# Patient Record
Sex: Male | Born: 2000
Health system: Southern US, Community
[De-identification: ages and names within clinical notes are randomized; demographics above are authoritative.]

## PROBLEM LIST (undated history)

## (undated) HISTORY — PX: TYMPANOSTOMY TUBE PLACEMENT: SHX32

---
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
## 2019-01-06 ENCOUNTER — Encounter

## 2019-01-12 ENCOUNTER — Ambulatory Visit (HOSPITAL_COMMUNITY): Payer: 59 | Admitting: Physical Therapy

## 2019-01-12 ENCOUNTER — Encounter (HOSPITAL_COMMUNITY): Payer: Self-pay

## 2019-01-25 ENCOUNTER — Ambulatory Visit (HOSPITAL_COMMUNITY): Payer: 59 | Admitting: Physical Therapy

## 2019-09-05 DIAGNOSIS — M25512 Pain in left shoulder: Secondary | ICD-10-CM | POA: Diagnosis not present

## 2019-09-05 MED FILL — predniSONE 10 MG TABS: 10 | 12 days supply | Qty: 42 | Fill #0

## 2019-09-08 ENCOUNTER — Encounter: Payer: Self-pay | Admitting: Family Medicine

## 2019-09-08 ENCOUNTER — Telehealth (INDEPENDENT_AMBULATORY_CARE_PROVIDER_SITE_OTHER): Payer: 59 | Admitting: Family Medicine

## 2019-09-08 ENCOUNTER — Other Ambulatory Visit: Payer: Self-pay

## 2019-09-08 VITALS — Temp 100.4°F | Ht 72.0 in | Wt 185.0 lb

## 2019-09-08 DIAGNOSIS — R509 Fever, unspecified: Secondary | ICD-10-CM | POA: Diagnosis not present

## 2019-09-08 DIAGNOSIS — U071 COVID-19: Secondary | ICD-10-CM

## 2019-09-08 DIAGNOSIS — R05 Cough: Secondary | ICD-10-CM

## 2019-09-08 DIAGNOSIS — R52 Pain, unspecified: Secondary | ICD-10-CM | POA: Diagnosis not present

## 2019-09-08 DIAGNOSIS — R110 Nausea: Secondary | ICD-10-CM

## 2019-09-08 DIAGNOSIS — R109 Unspecified abdominal pain: Secondary | ICD-10-CM | POA: Diagnosis not present

## 2019-09-08 DIAGNOSIS — R0989 Other specified symptoms and signs involving the circulatory and respiratory systems: Secondary | ICD-10-CM | POA: Diagnosis not present

## 2019-09-08 DIAGNOSIS — R059 Cough, unspecified: Secondary | ICD-10-CM

## 2019-09-08 LAB — POC COVID19 BINAXNOW: SARS Coronavirus 2 Ag: POSITIVE — AB

## 2019-09-08 LAB — POCT INFLUENZA A/B
Influenza A, POC: NEGATIVE
Influenza B, POC: NEGATIVE

## 2019-09-08 NOTE — Patient Instructions (Signed)
Use tylenol as needed for pain (headache) and fever. Avoid Advil (also because you're currently on prednisone).  You may use decongestants such as sudafed as needed for congestion and sinus pain. I recommend using single ingredient rather than multi-symptom medication so you don't overlap and get too much of acetaminophen, dextromethorphan or other ingredients.  Drink plenty of fluid. You may use Mucinex DM 12 hour twice daily for cough (you can use Robitussin DM instead, but it is short-acting). If you already have Delsym syrup at home (which is the DM which lasts 12 hours), then just get a plain mucinex 12 hour.  If this isn't adequately helping your cough, the next cough medication options are prescription only--tessalon (tablets which aren't sedating) or hydrocodone syrup (sedating, can't take while working, mainly to take at night so you can sleep).   COVID-19 COVID-19 is a respiratory infection that is caused by a virus called severe acute respiratory syndrome coronavirus 2 (SARS-CoV-2). The disease is also known as coronavirus disease or novel coronavirus. In some people, the virus may not cause any symptoms. In others, it may cause a serious infection. The infection can get worse quickly and can lead to complications, such as:  Pneumonia, or infection of the lungs.  Acute respiratory distress syndrome or ARDS. This is a condition in which fluid build-up in the lungs prevents the lungs from filling with air and passing oxygen into the blood.  Acute respiratory failure. This is a condition in which there is not enough oxygen passing from the lungs to the body or when carbon dioxide is not passing from the lungs out of the body.  Sepsis or septic shock. This is a serious bodily reaction to an infection.  Blood clotting problems.  Secondary infections due to bacteria or fungus.  Organ failure. This is when your body's organs stop working. The virus that causes COVID-19 is contagious.  This means that it can spread from person to person through droplets from coughs and sneezes (respiratory secretions). What are the causes? This illness is caused by a virus. You may catch the virus by:  Breathing in droplets from an infected person. Droplets can be spread by a person breathing, speaking, singing, coughing, or sneezing.  Touching something, like a table or a doorknob, that was exposed to the virus (contaminated) and then touching your mouth, nose, or eyes. What increases the risk? Risk for infection You are more likely to be infected with this virus if you:  Are within 6 feet (2 meters) of a person with COVID-19.  Provide care for or live with a person who is infected with COVID-19.  Spend time in crowded indoor spaces or live in shared housing. Risk for serious illness You are more likely to become seriously ill from the virus if you:  Are 21 years of age or older. The higher your age, the more you are at risk for serious illness.  Live in a nursing home or long-term care facility.  Have cancer.  Have a long-term (chronic) disease such as: ? Chronic lung disease, including chronic obstructive pulmonary disease or asthma. ? A long-term disease that lowers your body's ability to fight infection (immunocompromised). ? Heart disease, including heart failure, a condition in which the arteries that lead to the heart become narrow or blocked (coronary artery disease), a disease which makes the heart muscle thick, weak, or stiff (cardiomyopathy). ? Diabetes. ? Chronic kidney disease. ? Sickle cell disease, a condition in which red blood cells have an  abnormal "sickle" shape. ? Liver disease.  Are obese. What are the signs or symptoms? Symptoms of this condition can range from mild to severe. Symptoms may appear any time from 2 to 14 days after being exposed to the virus. They include:  A fever or chills.  A cough.  Difficulty breathing.  Headaches, body aches, or  muscle aches.  Runny or stuffy (congested) nose.  A sore throat.  New loss of taste or smell. Some people may also have stomach problems, such as nausea, vomiting, or diarrhea. Other people may not have any symptoms of COVID-19. How is this diagnosed? This condition may be diagnosed based on:  Your signs and symptoms, especially if: ? You live in an area with a COVID-19 outbreak. ? You recently traveled to or from an area where the virus is common. ? You provide care for or live with a person who was diagnosed with COVID-19. ? You were exposed to a person who was diagnosed with COVID-19.  A physical exam.  Lab tests, which may include: ? Taking a sample of fluid from the back of your nose and throat (nasopharyngeal fluid), your nose, or your throat using a swab. ? A sample of mucus from your lungs (sputum). ? Blood tests.  Imaging tests, which may include, X-rays, CT scan, or ultrasound. How is this treated? At present, there is no medicine to treat COVID-19. Medicines that treat other diseases are being used on a trial basis to see if they are effective against COVID-19. Your health care provider will talk with you about ways to treat your symptoms. For most people, the infection is mild and can be managed at home with rest, fluids, and over-the-counter medicines. Treatment for a serious infection usually takes places in a hospital intensive care unit (ICU). It may include one or more of the following treatments. These treatments are given until your symptoms improve.  Receiving fluids and medicines through an IV.  Supplemental oxygen. Extra oxygen is given through a tube in the nose, a face mask, or a hood.  Positioning you to lie on your stomach (prone position). This makes it easier for oxygen to get into the lungs.  Continuous positive airway pressure (CPAP) or bi-level positive airway pressure (BPAP) machine. This treatment uses mild air pressure to keep the airways open. A  tube that is connected to a motor delivers oxygen to the body.  Ventilator. This treatment moves air into and out of the lungs by using a tube that is placed in your windpipe.  Tracheostomy. This is a procedure to create a hole in the neck so that a breathing tube can be inserted.  Extracorporeal membrane oxygenation (ECMO). This procedure gives the lungs a chance to recover by taking over the functions of the heart and lungs. It supplies oxygen to the body and removes carbon dioxide. Follow these instructions at home: Lifestyle  If you are sick, stay home except to get medical care. Your health care provider will tell you how long to stay home. Call your health care provider before you go for medical care.  Rest at home as told by your health care provider.  Do not use any products that contain nicotine or tobacco, such as cigarettes, e-cigarettes, and chewing tobacco. If you need help quitting, ask your health care provider.  Return to your normal activities as told by your health care provider. Ask your health care provider what activities are safe for you. General instructions  Take over-the-counter and prescription  medicines only as told by your health care provider.  Drink enough fluid to keep your urine pale yellow.  Keep all follow-up visits as told by your health care provider. This is important. How is this prevented?  There is no vaccine to help prevent COVID-19 infection. However, there are steps you can take to protect yourself and others from this virus. To protect yourself:   Do not travel to areas where COVID-19 is a risk. The areas where COVID-19 is reported change often. To identify high-risk areas and travel restrictions, check the CDC travel website: [employer].siwwwnc.cdc.gov/travel/notices  If you live in, or must travel to, an area where COVID-19 is a risk, take precautions to avoid infection. ? Stay away from people who are sick. ? Wash your hands often with soap and water  for 20 seconds. If soap and water are not available, use an alcohol-based hand sanitizer. ? Avoid touching your mouth, face, eyes, or nose. ? Avoid going out in public, follow guidance from your state and local health authorities. ? If you must go out in public, wear a cloth face covering or face mask. Make sure your mask covers your nose and mouth. ? Avoid crowded indoor spaces. Stay at least 6 feet (2 meters) away from others. ? Disinfect objects and surfaces that are frequently touched every day. This may include:  Counters and tables.  Doorknobs and light switches.  Sinks and faucets.  Electronics, such as phones, remote controls, keyboards, computers, and tablets. To protect others: If you have symptoms of COVID-19, take steps to prevent the virus from spreading to others.  If you think you have a COVID-19 infection, contact your health care provider right away. Tell your health care team that you think you may have a COVID-19 infection.  Stay home. Leave your house only to seek medical care. Do not use public transport.  Do not travel while you are sick.  Wash your hands often with soap and water for 20 seconds. If soap and water are not available, use alcohol-based hand sanitizer.  Stay away from other members of your household. Let healthy household members care for children and pets, if possible. If you have to care for children or pets, wash your hands often and wear a mask. If possible, stay in your own room, separate from others. Use a different bathroom.  Make sure that all people in your household wash their hands well and often.  Cough or sneeze into a tissue or your sleeve or elbow. Do not cough or sneeze into your hand or into the air.  Wear a cloth face covering or face mask. Make sure your mask covers your nose and mouth. Where to find more information  Centers for Disease Control and Prevention: StickerEm[employer]ium.tnwww.cdc.gov/coronavirus/2019-ncov/index.html  World Health  Organization: https://thompson-craig.com/www.who.int/health-topics/coronavirus Contact a health care provider if:  You live in or have traveled to an area where COVID-19 is a risk and you have symptoms of the infection.  You have had contact with someone who has COVID-19 and you have symptoms of the infection. Get help right away if:  You have trouble breathing.  You have pain or pressure in your chest.  You have confusion.  You have bluish lips and fingernails.  You have difficulty waking from sleep.  You have symptoms that get worse. These symptoms may represent a serious problem that is an emergency. Do not wait to see if the symptoms will go away. Get medical help right away. Call your local emergency services (911  in the [place] Do not drive yourself to the hospital. Let the emergency medical personnel know if you think you have COVID-19. Summary  COVID-19 is a respiratory infection that is caused by a virus. It is also known as coronavirus disease or novel coronavirus. It can cause serious infections, such as pneumonia, acute respiratory distress syndrome, acute respiratory failure, or sepsis.  The virus that causes COVID-19 is contagious. This means that it can spread from person to person through droplets from breathing, speaking, singing, coughing, or sneezing.  You are more likely to develop a serious illness if you are 50 years of age or older, have a weak immune system, live in a nursing home, or have chronic disease.  There is no medicine to treat COVID-19. Your health care provider will talk with you about ways to treat your symptoms.  Take steps to protect yourself and others from infection. Wash your hands often and disinfect objects and surfaces that are frequently touched every day. Stay away from people who are sick and wear a mask if you are sick. This information is not intended to replace advice given to you by your health care provider. Make sure you discuss any questions you have with your  health care provider. Document Revised: 10/22/2018 Document Reviewed: 01/28/2018 Elsevier Patient Education  2020 [employer].

## 2019-09-08 NOTE — Progress Notes (Signed)
Start time: 3:13 End time: 3:37  Virtual Visit via Video Note  I connected with Damon Glenn on 09/08/19 by a video enabled telemedicine application and verified that I am speaking with the correct person using two identifiers.  Location: Patient: home Provider: office   I discussed the limitations of evaluation and management by telemedicine and the availability of in person appointments. The patient expressed understanding and agreed to proceed.  History of Present Illness:  Chief Complaint  Patient presents with  . Cough    VIRTUAL cough, fever, body aches, runny nose and nausea. Positive COVID. Sent off PCR per request of mom for job.    Yesterday he noted a cough. Last night he developed nausea, and later woke up with a fever, T 100.4 He now has a headache.   Current symptoms include headache, some sinus congestion, mucus is clear. Normal smell/taste. Denies shortness of breath.  +cough is worse at night. Hasn't taken any OTC meds. Not vaccinated against COVID  No known sick contacts. Last weekend went to [place] to visit friends.  He reports being indoors, not wearing masks, with a lot of people.  PMH is unremarkable.  Noted to be taking prednisone. He reports he saw ortho on Monday, L shoulder AC joint injury, and ortho put him on a Prednisone taper  Works stock at [employer]  Outpatient Encounter Medications as of 09/08/2019  Medication Sig Note  . acetaminophen (TYLENOL) 500 MG tablet Take 1,000 mg by mouth every 6 (six) hours as needed. 09/08/2019: Last dose 8am  . predniSONE (DELTASONE) 10 MG tablet Take by mouth.   Marland Kitchen ibuprofen (ADVIL,MOTRIN) 200 MG tablet Take 400 mg by mouth every 6 (six) hours as needed. (Patient not taking: Reported on 09/08/2019)    No facility-administered encounter medications on file as of 09/08/2019.   No Known Allergies  ROS:  Fever, headache, URI symptoms per HPI.  +nausea. No vomiting or bowel changes.  No rashes, chest pain, shortness of  breath, loss of taste or smell.  No other complaints.    Observations/Objective:  Temp (!) 100.4 F (38 C) (Oral)   Ht 6' (1.829 m)   Wt 185 lb (83.9 kg)   BMI 25.09 kg/m    Well-appearing, pleasant male, in no distress. He is alert, oriented, cranial nerves intact. He is speaking easily, in no distress.  There is no coughing, sneezing or throat-clearing noted during the visit.  He appears comfortable. Exam is limited due to virtual nature of the visit.  Rapid COVID test + Flu tests -   Assessment and Plan:  COVID-19 virus infection - counseled re: supportive measures, s/sx for which he should seek immediate care. All questions answered. OOW x 10d from yest  Fever, unspecified fever cause - Plan: POC COVID-19, Influenza A/B, Novel Coronavirus, NAA ([facility])  Cough - Plan: POC COVID-19, Influenza A/B, Novel Coronavirus, NAA ([facility])  Runny nose - Plan: POC COVID-19, Influenza A/B, Novel Coronavirus, NAA ([facility])  Body aches - Plan: POC COVID-19, Influenza A/B, Novel Coronavirus, NAA ([facility])  Nausea - Plan: POC COVID-19, Influenza A/B, Novel Coronavirus, NAA ([facility])  Educated re: isolation, advising contacts.   Follow Up Instructions:  Use tylenol as needed for pain (headache) and fever. You may use decongestants such as sudafed as needed for congestion and sinus pain. Drink plenty of fluid. You may use Mucinex DM 12 hour twice daily for cough (you can use Robitussin DM instead, but it is short-acting). If you already have Delsym syrup  at home (which is the DM which lasts 12 hours), then just get a plain mucinex 12 hour.  Note for work-- OOW x 10 days since onset of symptoms (9/1)   I discussed the assessment and treatment plan with the patient. The patient was provided an opportunity to ask questions and all were answered. The patient agreed with the plan and demonstrated an understanding of the instructions.   The patient was advised to call back or seek an  in-person evaluation if the symptoms worsen or if the condition fails to improve as anticipated.  I provided 24 minutes of video face-to-face time during this encounter. Additional time spent in chart review and documentation.   Lavonda Jumbo, MD

## 2019-09-09 ENCOUNTER — Encounter: Payer: Self-pay | Admitting: Internal Medicine

## 2019-09-09 LAB — NOVEL CORONAVIRUS, NAA: SARS-CoV-2, NAA: DETECTED — AB

## 2019-09-09 NOTE — Progress Notes (Signed)
I have printed note for him, I have also called health department to find out if this needs to be reported. Waiting on a call back. Veronica did not contace health department

## 2019-09-09 NOTE — Progress Notes (Signed)
Sent form to health department

## 2019-11-24 DIAGNOSIS — M25512 Pain in left shoulder: Secondary | ICD-10-CM | POA: Diagnosis not present

## 2019-11-24 DIAGNOSIS — S4352XD Sprain of left acromioclavicular joint, subsequent encounter: Secondary | ICD-10-CM | POA: Diagnosis not present

## 2019-12-09 IMAGING — MR MR HEAD W/O CM
8 series · 48 of 48 positions shown · non-contrast
Comparison: CT HEAD December 11, 2015

CLINICAL DATA: Progressively worsening headaches and neck pain for
3 days, no injury.

EXAM:
MRI HEAD WITHOUT CONTRAST
TECHNIQUE: Multiplanar, multiecho pulse sequences of the brain and surrounding
structures were obtained without intravenous contrast.

[Series 5: T1 · sagittal · 4.0mm · 0.75mm/px · 4 of 31 slices shown (1 of 2)]
[im 1/31]
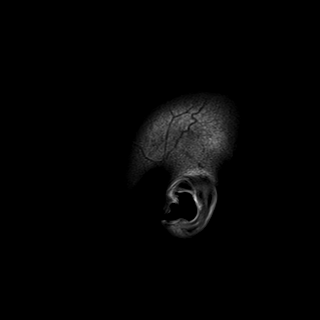
[im 11/31]
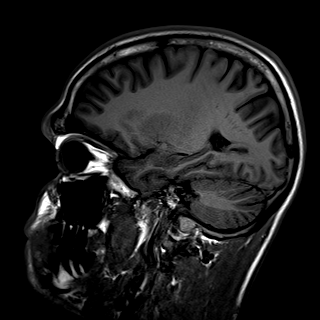
[im 21/31]
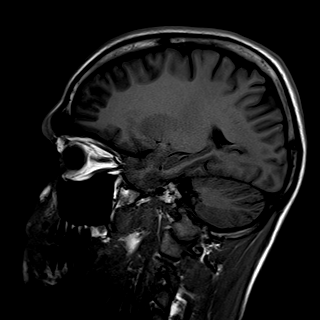
[im 31/31]
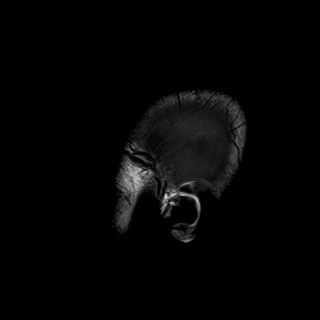

[Series 6: DWI · axial · 3.0mm · 1.44mm/px · z∈[-55,+87]mm · 9 of 88 slices shown (1 of 2)]
[im 1/88]
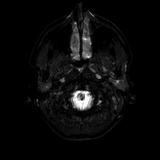
[im 11/88]
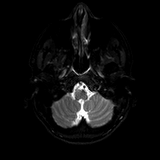
[im 22/88]
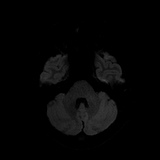
[im 33/88]
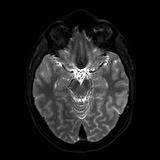
[im 44/88]
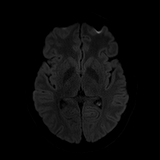
[im 55/88]
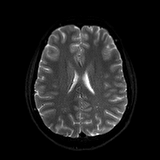
[im 66/88]
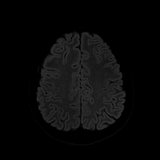
[im 77/88]
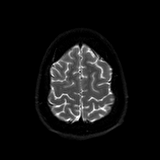
[im 88/88]
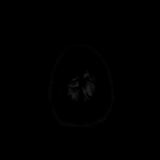

[Series 7: DWI · axial · 3.0mm · 1.44mm/px · z∈[-55,+87]mm · 4 of 44 slices shown (2 of 2)]
[im 1/44]
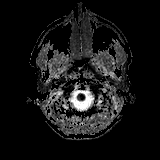
[im 15/44]
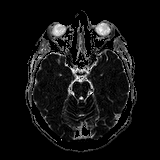
[im 29/44]
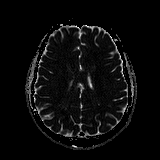
[im 44/44]
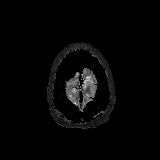

[Series 9: swi_images · axial · 3.0mm · 0.90mm/px · z∈[-61,+92]mm · 5 of 52 slices shown]
[im 1/52]
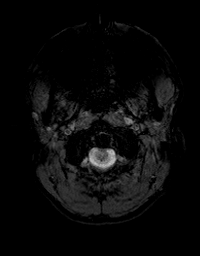
[im 13/52]
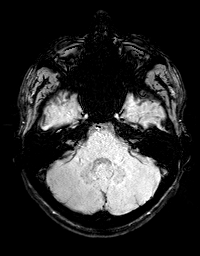
[im 26/52]
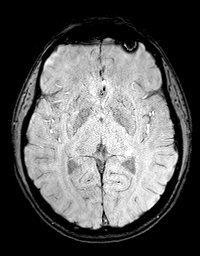
[im 39/52]
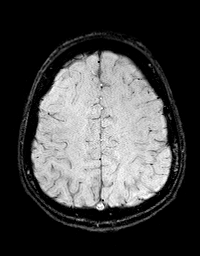
[im 52/52]
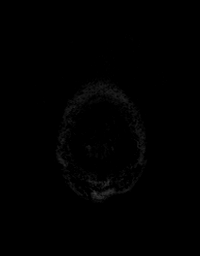

[Series 10: T2 · axial · 4.0mm · 0.36mm/px · z∈[-57,+88]mm · 3 of 30 slices shown (1 of 2)]
[im 1/30]
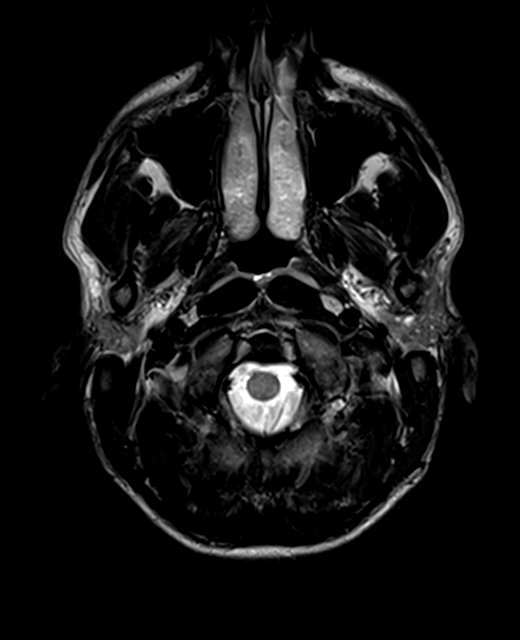
[im 15/30]
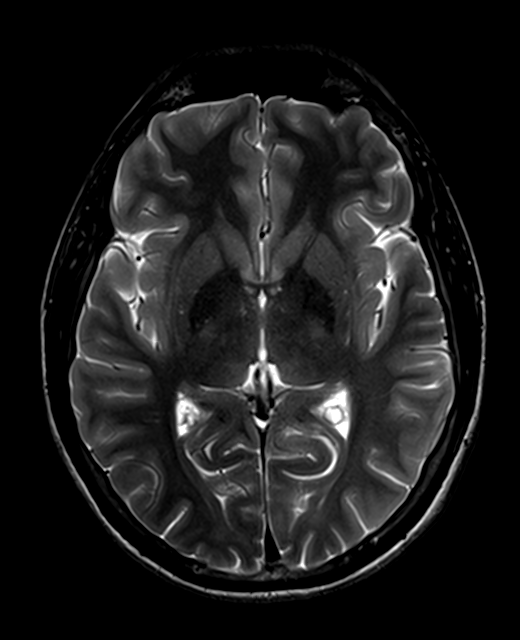
[im 30/30]
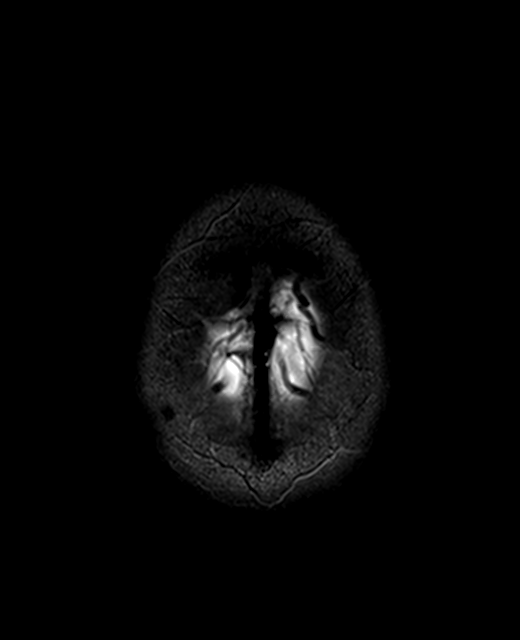

[Series 11: FLAIR · axial · 3.0mm · 0.72mm/px · z∈[-59,+90]mm · 4 of 44 slices shown]
[im 1/44]
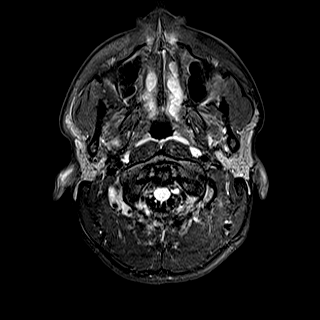
[im 15/44]
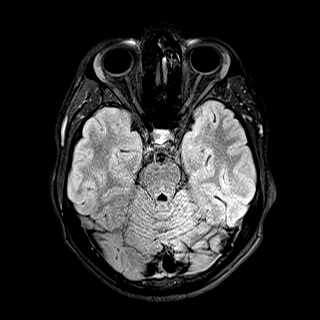
[im 29/44]
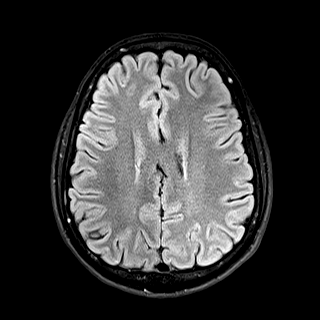
[im 44/44]
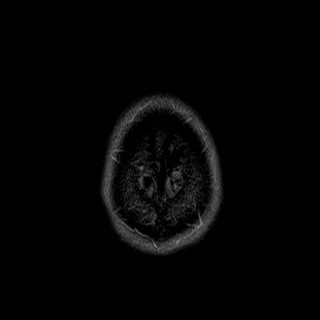

[Series 12: T1 · axial · 1.0mm · 0.90mm/px · z∈[-64,+95]mm · 16 of 160 slices shown (2 of 2)]
[im 1/160]
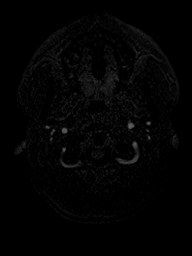
[im 11/160]
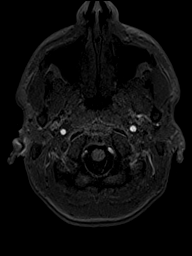
[im 22/160]
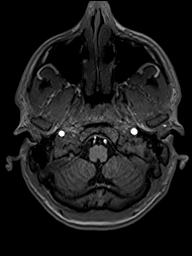
[im 32/160]
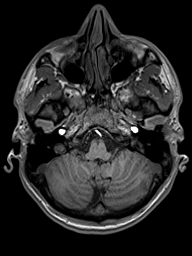
[im 43/160]
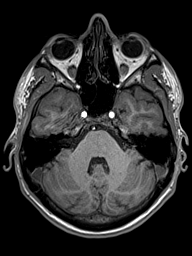
[im 54/160]
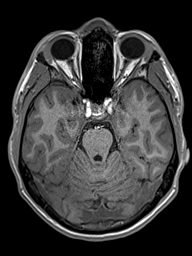
[im 64/160]
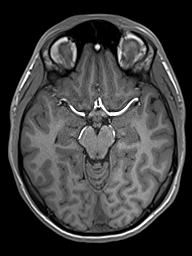
[im 75/160]
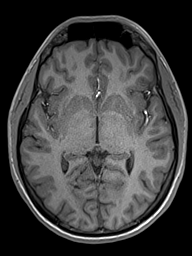
[im 85/160]
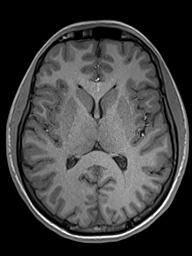
[im 96/160]
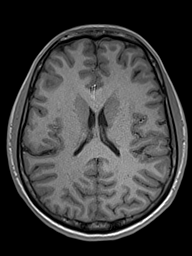
[im 107/160]
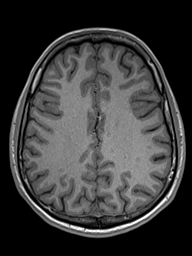
[im 117/160]
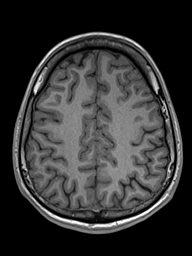
[im 128/160]
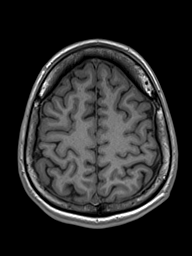
[im 138/160]
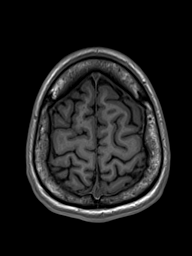
[im 149/160]
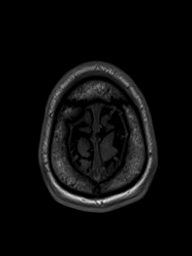
[im 160/160]
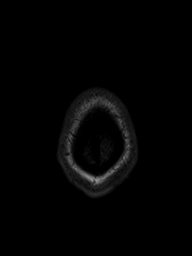

[Series 13: T2 · coronal · 4.5mm · 0.36mm/px · 3 of 30 slices shown (2 of 2)]
[im 1/30]
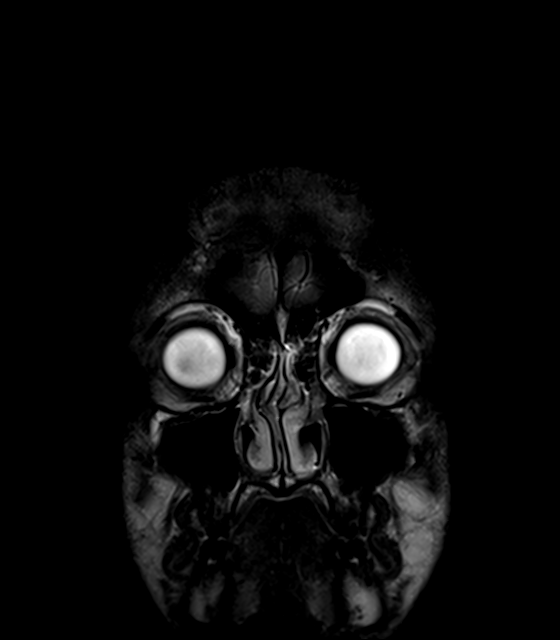
[im 15/30]
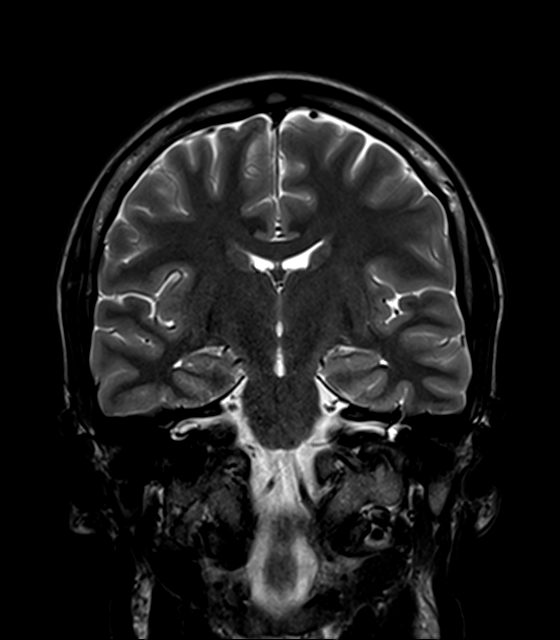
[im 30/30]
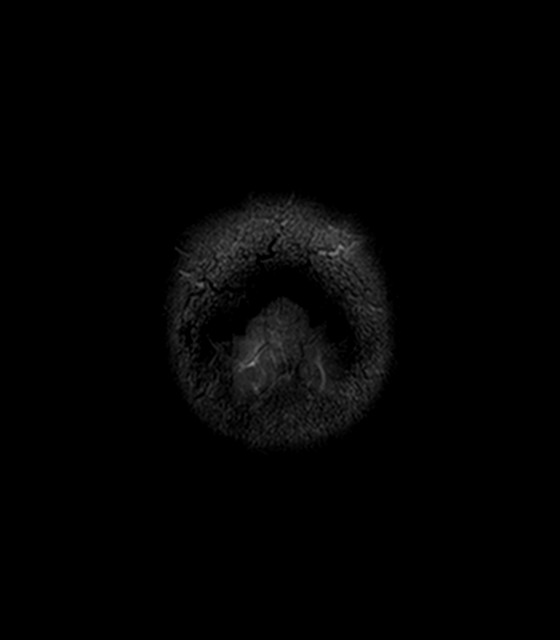

[48 of 48 positions shown; findings below may reference images not displayed]

FINDINGS: INTRACRANIAL CONTENTS: No reduced diffusion to suggest acute
ischemia or hyperacute demyelination. No susceptibility artifact to
suggest hemorrhage. The ventricles and sulci are normal for
patient's age. No suspicious parenchymal signal, masses, mass
effect. No abnormal extra-axial fluid collections. No extra-axial
masses.

VASCULAR: Normal major intracranial vascular flow voids present at
skull base.

SKULL AND UPPER CERVICAL SPINE: No abnormal sellar expansion. No
suspicious calvarial bone marrow signal. Craniocervical junction
maintained.

SINUSES/ORBITS: The mastoid air-cells and included paranasal sinuses
are well-aerated.The included ocular globes and orbital contents are
non-suspicious.

OTHER: None.
IMPRESSION: Normal noncontrast MRI of the head.

## 2019-12-20 DIAGNOSIS — M25512 Pain in left shoulder: Secondary | ICD-10-CM | POA: Diagnosis not present

## 2019-12-23 DIAGNOSIS — M25512 Pain in left shoulder: Secondary | ICD-10-CM | POA: Diagnosis not present

## 2020-01-17 DIAGNOSIS — M25512 Pain in left shoulder: Secondary | ICD-10-CM | POA: Diagnosis not present

## 2020-07-04 DIAGNOSIS — M7542 Impingement syndrome of left shoulder: Secondary | ICD-10-CM | POA: Diagnosis not present

## 2020-07-04 DIAGNOSIS — M7522 Bicipital tendinitis, left shoulder: Secondary | ICD-10-CM | POA: Diagnosis not present

## 2020-07-23 DIAGNOSIS — S92414A Nondisplaced fracture of proximal phalanx of right great toe, initial encounter for closed fracture: Secondary | ICD-10-CM | POA: Diagnosis not present

## 2020-08-02 DIAGNOSIS — S92404A Nondisplaced unspecified fracture of right great toe, initial encounter for closed fracture: Secondary | ICD-10-CM | POA: Diagnosis not present

## 2020-08-03 ENCOUNTER — Other Ambulatory Visit (HOSPITAL_COMMUNITY): Payer: Self-pay

## 2020-08-03 ENCOUNTER — Other Ambulatory Visit: Payer: Self-pay

## 2020-08-03 ENCOUNTER — Telehealth: Payer: 59 | Admitting: Family Medicine

## 2020-08-03 ENCOUNTER — Ambulatory Visit (INDEPENDENT_AMBULATORY_CARE_PROVIDER_SITE_OTHER): Payer: 59 | Admitting: Family Medicine

## 2020-08-03 VITALS — Temp 98.6°F | Wt 185.0 lb

## 2020-08-03 DIAGNOSIS — J020 Streptococcal pharyngitis: Secondary | ICD-10-CM

## 2020-08-03 MED ORDER — AMOXICILLIN 875 MG PO TABS
875.0000 mg | ORAL_TABLET | Freq: Two times a day (BID) | ORAL | 0 refills | Status: DC
  Filled 2020-08-03: qty 20, 10d supply, fill #0

## 2020-08-03 NOTE — Progress Notes (Signed)
   Subjective:    Patient ID: Damon Glenn, male    DOB: November 20, 2000, 20 y.o.   MRN: [id number]  HPI Documentation for virtual audio and video telecommunications through Caregility encounter: The patient was located at home. 2 patient identifiers used.  The p%20rovider was located in the office. The patient did consent to this visit and is aware of possible charges through their insurance for this visit. The other persons participating in this telemedicine service were none. Time spent on call was 3 minutes and in review of previous records >20 minutes total for counseling and coordination of care. This virtual service is not related to other E/M service within previous 7 days.  He complains of a 3-day history of sore throat, headache, rhinorrhea, chills but no fever.  One of his friends was diagnosed with strep that he has been around recently.  Review of Systems     Objective:   Physical Exam Alert and in no distress otherwise not examined       Assessment & Plan:  Pharyngitis due to Streptococcus species - Plan: amoxicillin (AMOXIL) 875 MG tablet I think clinically that he does have strep since he has been exposed to it and has similar symptoms.  Comfortable go ahead and treating him.

## 2020-08-06 DIAGNOSIS — M25512 Pain in left shoulder: Secondary | ICD-10-CM | POA: Diagnosis not present

## 2020-09-05 DIAGNOSIS — S92404A Nondisplaced unspecified fracture of right great toe, initial encounter for closed fracture: Secondary | ICD-10-CM | POA: Diagnosis not present

## 2020-11-09 ENCOUNTER — Ambulatory Visit (INDEPENDENT_AMBULATORY_CARE_PROVIDER_SITE_OTHER): Payer: 59 | Admitting: Medical

## 2020-11-09 ENCOUNTER — Other Ambulatory Visit (HOSPITAL_COMMUNITY): Payer: Self-pay

## 2020-11-09 ENCOUNTER — Other Ambulatory Visit: Payer: Self-pay

## 2020-11-09 VITALS — BP 140/90 | HR 76 | Wt 185.4 lb

## 2020-11-09 DIAGNOSIS — F411 Generalized anxiety disorder: Secondary | ICD-10-CM

## 2020-11-09 MED ORDER — FLUOXETINE HCL 10 MG PO CAPS
10.0000 mg | ORAL_CAPSULE | Freq: Every day | ORAL | 1 refills | Status: DC
  Filled 2020-11-09: qty 30, 30d supply, fill #0

## 2020-11-09 NOTE — Progress Notes (Signed)
Subjective:  Damon Glenn is a 20 y.o. male who presents for Chief Complaint  Patient presents with   Anxiety    Anxiety x 4-5 years. Never been on medications     Here today to discuss anxiety.  He notes he has had problems with this for at least 4 to 5 years.  He notes problems with social situation.  Lately he is avoided even spending time with friends as he gets real anxious about being around folks.  He has problems at work.  He works at [employer] but anytime a customer may come up to him to ask a question he gets sweaty, his heart beats fast and he gets anxious and panicked feeling.  He works full-time currently.  He is in school at [employer] daily because in a 2-year transfer program.  Currently he is doing online classes, however when he goes in person he has the same anxiety issues there and has trouble.  He sometimes can have panic attacks at work, driving in the car, recently he had a panic attack in the [employer] parking lot.  He was going to go into the store but felt panicky and had to sit in his car until he could gain his composure.  He has a girlfriend and does not normally have problems when spending time with her.  But in general one-on-one with other people or coworkers he has anxiety.  He has a good relationship with his parents.  He notes that his brother has similar problems with anxiety.  He denies depressed mood or mood swings.  No prior evaluation with counseling or psychiatry.   No other aggravating or relieving factors.    No other c/o.  The following portions of the patient's history were reviewed and updated as appropriate: allergies, current medications, past family history, past medical history, past social history, past surgical history and problem list.  ROS Otherwise as in subjective above   Objective: BP 140/90   Pulse 76   Wt 185 lb 6.4 oz (84.1 kg)   BMI 25.14 kg/m   General appearance: alert, no distress, well developed, well nourished, white  male Psych: pleasant, answers questions appropriately, relatively good eye contact    Assessment: Encounter Diagnosis  Name Primary?   Generalized anxiety disorder Yes     Plan: We discussed concerns, symptoms and timeframe he has had symptoms for several years now.  He has never seen counselor or mental health provider for this.  I recommended he start counseling right away.  He has access to employee assistance program through [employer].  I advised he start there as this will probably be a good option and probably free or low-cost  He does want to try medication.  We discussed options for medication.  Begin trial of fluoxetine.  Discussed risk, benefits, proper use of medicine and blackbox warnings.  We discussed ways to deal with stress and anxiety. I recommend regular exercise such as 30 minutes or more most days of the week such as walking running and bicycling I recommend taking some time to meditate or pray daily to help slow racing thoughts. I recommend working on relaxation techniques such as deep breathing exercises in a comfortable position relaxing your body.  There are free Apps on the smart phone for this for example Consider getting a massage Journal or use diary to express your ideas on paper to cope with anxiety and stress Work on time management, use a calendar or plan out things to avoid  stressing about things. Find ways to utilize your time to include exercise and personal "me" time. Some people use aromatherapy such as lavender to relax Some people use herbal teas to help calm their mood Spend some time with animals or your pet if you have one Consider seeing a counselor to help deal with anxiety and work on specific techniques   Damon Glenn was seen today for anxiety.  Diagnoses and all orders for this visit:  Generalized anxiety disorder  Other orders -     FLUoxetine (PROZAC) 10 MG capsule; Take 1 capsule (10 mg total) by mouth daily.    Follow up:  2wk

## 2020-11-23 ENCOUNTER — Encounter: Payer: Self-pay | Admitting: Medical

## 2020-11-23 ENCOUNTER — Other Ambulatory Visit (HOSPITAL_COMMUNITY): Payer: Self-pay

## 2020-11-23 ENCOUNTER — Ambulatory Visit: Payer: 59 | Admitting: Medical

## 2020-11-23 ENCOUNTER — Other Ambulatory Visit: Payer: Self-pay

## 2020-11-23 VITALS — BP 138/90 | HR 69 | Ht 72.0 in | Wt 184.2 lb

## 2020-11-23 DIAGNOSIS — J0100 Acute maxillary sinusitis, unspecified: Secondary | ICD-10-CM

## 2020-11-23 DIAGNOSIS — F411 Generalized anxiety disorder: Secondary | ICD-10-CM

## 2020-11-23 DIAGNOSIS — R051 Acute cough: Secondary | ICD-10-CM

## 2020-11-23 MED ORDER — AMOXICILLIN 875 MG PO TABS
875.0000 mg | ORAL_TABLET | Freq: Two times a day (BID) | ORAL | 0 refills | Status: AC
  Filled 2020-11-23: qty 20, 10d supply, fill #0

## 2020-11-23 MED ORDER — FLUOXETINE HCL 20 MG PO TABS
20.0000 mg | ORAL_TABLET | Freq: Every day | ORAL | 1 refills | Status: DC
  Filled 2020-11-23: qty 30, 30d supply, fill #0
  Filled 2020-12-31: qty 30, 30d supply, fill #1

## 2020-11-23 NOTE — Progress Notes (Signed)
Subjective:  Damon Glenn is a 20 y.o. male who presents for Chief Complaint  Patient presents with   Anxiety   Headache    Sinus pressure, sore throat, cough, nausea, nasal drainage for 1-2 weeks     He notes 1.5 weeks or more.  Worse last few days.  No fever, no body aches or chills, no vomiting or diarrhea.   +sinus pressure, sore throat, some cough.   No SOB or wheezing.   Using ibuprofen and Claritin, some sudafed.  Has some colored mucous x 4-5 days.    No sick contacts.    Here for 2 week check on new start on fluoxetine.  So far no side effects but no improvement either.  Mother working to set up appt for counseling.  No significant change in symptoms.    Last visit 2 weeks ago we discussed he has had problems with this for at least 4 to 5 years.  He notes problems with social situation.  Lately he is avoided even spending time with friends as he gets real anxious about being around folks.  He has problems at work.  He works at [employer] but anytime a customer may come up to him to ask a question he gets sweaty, his heart beats fast and he gets anxious and panicked feeling.  He works full-time currently.  He is in school at [employer] daily because in a 2-year transfer program.  Currently he is doing online classes, however when he goes in person he has the same anxiety issues there and has trouble.  He sometimes can have panic attacks at work, driving in the car, recently he had a panic attack in the [place] parking lot.  He was going to go into the store but felt panicky and had to sit in his car until he could gain his composure.  He has a girlfriend and does not normally have problems when spending time with her.  But in general one-on-one with other people or coworkers he has anxiety.  He has a good relationship with his parents.  He notes that his brother has similar problems with anxiety.  He denies depressed mood or mood swings.  no other aggravating or relieving factors.    No  other c/o.  The following portions of the patient's history were reviewed and updated as appropriate: allergies, current medications, past family history, past medical history, past social history, past surgical history and problem list.  ROS Otherwise as in subjective above   Objective: BP 138/90 (BP Location: Right Arm, Patient Position: Sitting)   Pulse 69   Ht 6' (1.829 m)   Wt 184 lb 3.2 oz (83.6 kg)   SpO2 99%   BMI 24.98 kg/m   General appearance: alert, no distress, well developed, well nourished, white male Psych: pleasant, answers questions appropriately, relatively good eye contact HEENT: normocephalic, sclerae anicteric, TMs pearly, nares with thick dry mucous, +turbinate edema on left, +erythema, pharynx normal Oral cavity: MMM, no lesions Neck: supple, no lymphadenopathy, no thyromegaly, no masses Heart: RRR, normal S1, S2, no murmurs Lungs: CTA bilaterally, no wheezes, rhonchi, or rales Pulses: 2+ symmetric, upper and lower extremities, normal cap refill    Assessment: Encounter Diagnoses  Name Primary?   Acute non-recurrent maxillary sinusitis Yes   Acute cough    Generalized anxiety disorder       Plan: Sinusitis , cough - begin amoxicillin, continue rest, hydration supportive measures.   Increase to Fluoxetine 20mg  daily.  Continue efforts  to establish with counseling.  We discussed ways to deal with stress and anxiety. I recommend regular exercise such as 30 minutes or more most days of the week such as walking running and bicycling I recommend taking some time to meditate or pray daily to help slow racing thoughts. I recommend working on relaxation techniques such as deep breathing exercises in a comfortable position relaxing your body.  There are free Apps on the smart phone for this for example Consider getting a massage Journal or use diary to express your ideas on paper to cope with anxiety and stress Work on time management, use a calendar or  plan out things to avoid stressing about things. Find ways to utilize your time to include exercise and personal "me" time. Some people use aromatherapy such as lavender to relax Some people use herbal teas to help calm their mood Spend some time with animals or your pet if you have one Consider seeing a counselor to help deal with anxiety and work on specific techniques   Damon Glenn was seen today for anxiety and headache.  Diagnoses and all orders for this visit:  Acute non-recurrent maxillary sinusitis  Acute cough  Generalized anxiety disorder  Other orders -     amoxicillin (AMOXIL) 875 MG tablet; Take 1 tablet (875 mg total) by mouth 2 (two) times daily for 10 days. -     FLUoxetine (PROZAC) 20 MG tablet; Take 1 tablet (20 mg total) by mouth daily.    Follow up: call report 2-3 wk

## 2020-12-28 ENCOUNTER — Encounter: Payer: Self-pay | Admitting: Family Medicine

## 2020-12-28 ENCOUNTER — Telehealth: Payer: Self-pay | Admitting: Family Medicine

## 2020-12-28 MED ORDER — OSELTAMIVIR PHOSPHATE 75 MG PO CAPS: 75.0000 mg | ORAL_CAPSULE | Freq: Two times a day (BID) | ORAL | 0 refills | Status: DC

## 2020-12-28 NOTE — Telephone Encounter (Signed)
Pt  tested positive for flu yesterday pt has sore throat fever congestion. Please call [phone] please send RX to the [facility] in [place],   Can pt get a work note,

## 2021-01-01 ENCOUNTER — Other Ambulatory Visit (HOSPITAL_COMMUNITY): Payer: Self-pay

## 2021-01-23 ENCOUNTER — Other Ambulatory Visit: Payer: Self-pay | Admitting: Medical

## 2021-01-23 ENCOUNTER — Other Ambulatory Visit (HOSPITAL_COMMUNITY): Payer: Self-pay

## 2021-01-23 MED ORDER — FLUOXETINE HCL 20 MG PO TABS
20.0000 mg | ORAL_TABLET | Freq: Every day | ORAL | 2 refills | Status: DC
  Filled 2021-01-23 – 2021-01-24 (×2): qty 30, 30d supply, fill #0
  Filled 2021-03-03: qty 30, 30d supply, fill #1
  Filled 2021-04-15: qty 30, 30d supply, fill #2

## 2021-01-24 ENCOUNTER — Other Ambulatory Visit (HOSPITAL_COMMUNITY): Payer: Self-pay

## 2021-03-04 ENCOUNTER — Other Ambulatory Visit (HOSPITAL_COMMUNITY): Payer: Self-pay

## 2021-04-15 ENCOUNTER — Other Ambulatory Visit (HOSPITAL_COMMUNITY): Payer: Self-pay

## 2021-07-05 ENCOUNTER — Telehealth: Payer: Self-pay | Admitting: Internal Medicine

## 2021-07-05 ENCOUNTER — Other Ambulatory Visit (HOSPITAL_COMMUNITY): Payer: Self-pay

## 2021-07-05 MED ORDER — FLUOXETINE HCL 20 MG PO TABS
20.0000 mg | ORAL_TABLET | Freq: Every day | ORAL | 0 refills | Status: DC
  Filled 2021-07-05: qty 30, 30d supply, fill #0

## 2021-07-05 NOTE — Telephone Encounter (Signed)
Pt is scheduled for July 11th in afternoon for cpe but needs refill on med

## 2021-07-16 ENCOUNTER — Other Ambulatory Visit: Payer: Self-pay

## 2021-07-16 ENCOUNTER — Encounter: Payer: Self-pay | Admitting: Medical

## 2021-07-16 ENCOUNTER — Ambulatory Visit (INDEPENDENT_AMBULATORY_CARE_PROVIDER_SITE_OTHER): Payer: 59 | Admitting: Medical

## 2021-07-16 ENCOUNTER — Other Ambulatory Visit (HOSPITAL_COMMUNITY): Payer: Self-pay

## 2021-07-16 VITALS — BP 130/80 | HR 69 | Ht 71.0 in | Wt 192.2 lb

## 2021-07-16 DIAGNOSIS — F419 Anxiety disorder, unspecified: Secondary | ICD-10-CM | POA: Insufficient documentation

## 2021-07-16 DIAGNOSIS — Z113 Encounter for screening for infections with a predominantly sexual mode of transmission: Secondary | ICD-10-CM

## 2021-07-16 DIAGNOSIS — R030 Elevated blood-pressure reading, without diagnosis of hypertension: Secondary | ICD-10-CM | POA: Insufficient documentation

## 2021-07-16 DIAGNOSIS — Z0000 Encounter for general adult medical examination without abnormal findings: Secondary | ICD-10-CM | POA: Insufficient documentation

## 2021-07-16 DIAGNOSIS — Z7185 Encounter for immunization safety counseling: Secondary | ICD-10-CM | POA: Diagnosis not present

## 2021-07-16 DIAGNOSIS — Z23 Encounter for immunization: Secondary | ICD-10-CM | POA: Diagnosis not present

## 2021-07-16 DIAGNOSIS — Z8249 Family history of ischemic heart disease and other diseases of the circulatory system: Secondary | ICD-10-CM

## 2021-07-16 MED ORDER — FLUOXETINE HCL 20 MG PO TABS
20.0000 mg | ORAL_TABLET | Freq: Every day | ORAL | 3 refills | Status: DC
  Filled 2021-07-16: qty 90, 90d supply, fill #0

## 2021-07-16 NOTE — Progress Notes (Signed)
Subjective:   HPI  Damon Glenn is a 21 y.o. male who presents for Chief Complaint  Patient presents with   Annual Exam    Non Fasting CPE    Patient Care Team: Prajna Vanderpool, Camelia Eng, PA-C as PCP - General (Family Medicine) Sees dentist  Concerns: None, doing fine.   Compliant with medicaiton for anxiety.  Doing fine on this.   In school at [employer], college transfer classes.  Doing some work in Building control surveyor.  A-B grades.   Has girlfriend  Reviewed their medical, surgical, family, social, medication, and allergy history and updated chart as appropriate.  History reviewed. No pertinent past medical history.  Past Surgical History:  Procedure Laterality Date   TYMPANOSTOMY TUBE PLACEMENT      Family History  Problem Relation Age of Onset   Heart disease Father    Heart attack Father 30   Heart disease Paternal Grandfather    Cancer Other    Stroke Neg Hx      Current Outpatient Medications:    FLUoxetine (PROZAC) 20 MG tablet, Take 1 tablet (20 mg total) by mouth daily., Disp: 30 tablet, Rfl: 0  No Known Allergies   Review of Systems Constitutional: -fever, -chills, -sweats, -unexpected weight change, -decreased appetite, -fatigue Allergy: -sneezing, -itching, -congestion Dermatology: -changing moles, --rash, -lumps ENT: -runny nose, -ear pain, -sore throat, -hoarseness, -sinus pain, -teeth pain, - ringing in ears, -hearing loss, -nosebleeds Cardiology: -chest pain, -palpitations, -swelling, -difficulty breathing when lying flat, -waking up short of breath Respiratory: -cough, -shortness of breath, -difficulty breathing with exercise or exertion, -wheezing, -coughing up blood Gastroenterology: -abdominal pain, -nausea, -vomiting, -diarrhea, -constipation, -blood in stool, -changes in bowel movement, -difficulty swallowing or eating Hematology: -bleeding, -bruising  Musculoskeletal: -joint aches, -muscle aches, -joint swelling, -back pain, -neck pain, -cramping, -changes in  gait Ophthalmology: denies vision changes, eye redness, itching, discharge Urology: -burning with urination, -difficulty urinating, -blood in urine, -urinary frequency, -urgency, -incontinence Neurology: -headache, -weakness, -tingling, -numbness, -memory loss, -falls, -dizziness Psychology: -depressed mood, -agitation, -sleep problems Male GU: no testicular mass, pain, no lymph nodes swollen, no swelling, no rash.     07/16/2021    2:54 PM 11/09/2020    3:19 PM 07/05/2015    2:01 PM  Depression screen PHQ 2/9  Decreased Interest 0 1 0  Down, Depressed, Hopeless 0 0 0  PHQ - 2 Score 0 1 0  Altered sleeping  1   Tired, decreased energy  1   Change in appetite  0   Feeling bad or failure about yourself   0   Trouble concentrating  1   Moving slowly or fidgety/restless  0   Suicidal thoughts  0   PHQ-9 Score  4   Difficult doing work/chores  Not difficult at all         Objective:  BP 130/80   Pulse 69   Ht '5\' 11"'  (1.803 m)   Wt 192 lb 3.2 oz (87.2 kg)   SpO2 98%   BMI 26.81 kg/m   General appearance: alert, no distress, WD/WN, Caucasian male Skin: unremarkable HEENT: normocephalic, conjunctiva/corneas normal, sclerae anicteric, PERRLA, EOMi, nares patent, no discharge or erythema, pharynx normal Oral cavity: MMM, tongue normal, teeth normal Neck: supple, no lymphadenopathy, no thyromegaly, no masses, normal ROM, no bruits Chest: non tender, normal shape and expansion Heart: RRR, normal S1, S2, no murmurs Lungs: CTA bilaterally, no wheezes, rhonchi, or rales Abdomen: +bs, soft, non tender, non distended, no masses, no hepatomegaly, no splenomegaly,  no bruits Back: non tender, normal ROM, no scoliosis Musculoskeletal: upper extremities non tender, no obvious deformity, normal ROM throughout, lower extremities non tender, no obvious deformity, normal ROM throughout Extremities: no edema, no cyanosis, no clubbing Pulses: 2+ symmetric, upper and lower extremities, normal cap  refill Neurological: alert, oriented x 3, CN2-12 intact, strength normal upper extremities and lower extremities, sensation normal throughout, DTRs 2+ throughout, no cerebellar signs, gait normal Psychiatric: normal affect, behavior normal, pleasant  GU: normal male external genitalia,circumcised, nontender, no masses, no hernia, no lymphadenopathy Rectal: deferred  EKG reviewed   Assessment and Plan :   Encounter Diagnoses  Name Primary?   Encounter for health maintenance examination in adult Yes   Family history of premature coronary heart disease    Need for Tdap vaccination    Vaccine counseling    Anxiety    Elevated blood-pressure reading, without diagnosis of hypertension    Screen for STD (sexually transmitted disease)     This visit was a preventative care visit, also known as wellness visit or routine physical.   Topics typically include healthy lifestyle, diet, exercise, preventative care, vaccinations, sick and well care, proper use of emergency dept and after hours care, as well as other concerns.     Recommendations: Continue to return yearly for your annual wellness and preventative care visits.  This gives [place] a chance to discuss healthy lifestyle, exercise, vaccinations, review your chart record, and perform screenings where appropriate.  I recommend you see your eye doctor yearly for routine vision care.  I recommend you see your dentist yearly for routine dental care including hygiene visits twice yearly.   Vaccination recommendations were reviewed Immunization History  Administered Date(s) Administered   DTaP 10/30/1999, 12/26/1999, 02/21/2000, 12/14/2000, 09/13/2003   Hepatitis A 03/12/2006, 02/21/2008   Hepatitis B 08-15-99, 10/10/1999, 06/03/2000   HiB (PRP-OMP) 10/30/1999, 12/26/1999, 02/21/2000, 12/14/2000   Hpv-Unspecified 06/29/2012, 07/01/2013, 07/06/2014   IPV 10/30/1999, 12/26/1999, 06/03/2000, 09/13/2003   Influenza-Unspecified 10/22/2000,  02/21/2008   MMR 09/16/2000, 09/13/2003   Meningococcal Conjugate 06/06/2011   Pneumococcal Conjugate-13 12/26/1999, 06/03/2000, 12/14/2000   Tdap 05/10/2010   Varicella 09/16/2000, 03/12/2006    Counseled on the Tdap (tetanus, diptheria, and acellular pertussis) vaccine.  Vaccine information sheet given. Tdap vaccine given after consent obtained.  Due for 2nd Menveo, and both Bexsero vaccines . He will return monthly for these 3 vaccine boosters.    Screening for cancer: Colon cancer screening: Age 16  Testicular cancer screening You should do a monthly self testicular exam if you are between 65-30 years old  We discussed PSA, prostate exam, and prostate cancer screening risks/benefits.   Age 35  Skin cancer screening: Check your skin regularly for new changes, growing lesions, or other lesions of concern Come in for evaluation if you have skin lesions of concern.  Lung cancer screening: If you have a greater than 20 pack year history of tobacco use, then you may qualify for lung cancer screening with a chest CT scan.   Please call your insurance company to inquire about coverage for this test.  We currently don't have screenings for other cancers besides breast, cervical, colon, and lung cancers.  If you have a strong family history of cancer or have other cancer screening concerns, please let me know.    Bone health: Get at least 150 minutes of aerobic exercise weekly Get weight bearing exercise at least once weekly Bone density test:  A bone density test is an imaging test that uses  a type of X-ray to measure the amount of calcium and other minerals in your bones. The test may be used to diagnose or screen you for a condition that causes weak or thin bones (osteoporosis), predict your risk for a broken bone (fracture), or determine how well your osteoporosis treatment is working. The bone density test is recommended for females 76 and older, or females or males <94 if  certain risk factors such as thyroid disease, long term use of steroids such as for asthma or rheumatological issues, vitamin D deficiency, estrogen deficiency, family history of osteoporosis, self or family history of fragility fracture in first degree relative.    Heart health: Get at least 150 minutes of aerobic exercise weekly Limit alcohol It is important to maintain a healthy blood pressure and healthy cholesterol numbers  Heart disease screening: Screening for heart disease includes screening for blood pressure, fasting lipids, glucose/diabetes screening, BMI height to weight ratio, reviewed of smoking status, physical activity, and diet.    Goals include blood pressure 120/80 or less, maintaining a healthy lipid/cholesterol profile, preventing diabetes or keeping diabetes numbers under good control, not smoking or using tobacco products, exercising most days per week or at least 150 minutes per week of exercise, and eating healthy variety of fruits and vegetables, healthy oils, and avoiding unhealthy food choices like fried food, fast food, high sugar and high cholesterol foods.    Other tests may possibly include EKG test, CT coronary calcium score, echocardiogram, exercise treadmill stress test.    Medical care options: I recommend you continue to seek care here first for routine care.  We try really hard to have available appointments Monday through Friday daytime hours for sick visits, acute visits, and physicals.  Urgent care should be used for after hours and weekends for significant issues that cannot wait till the next day.  The emergency department should be used for significant potentially life-threatening emergencies.  The emergency department is expensive, can often have long wait times for less significant concerns, so try to utilize primary care, urgent care, or telemedicine when possible to avoid unnecessary trips to the emergency department.  Virtual visits and telemedicine  have been introduced since the pandemic started in 2020, and can be convenient ways to receive medical care.  We offer virtual appointments as well to assist you in a variety of options to seek medical care.    Separate significant issues discussed: Return for fasting labs  Counseled on safe sex, condom use, prevention  Family history of heart disease - consider baseline cardiology consult given father's history  Anxiety - doing fine on current medication  Elevated BPs - monitor BPs over next few weeks.  If > 130/80 consistently then will need recheck  Delaine was seen today for annual exam.  Diagnoses and all orders for this visit:  Encounter for health maintenance examination in adult -     Comprehensive metabolic panel; Future -     CBC; Future -     Lipid panel; Future -     TSH; Future -     RPR+HIV+GC+CT Panel; Future -     Hepatitis C antibody; Future -     Hepatitis B surface antigen; Future  Family history of premature coronary heart disease  Need for Tdap vaccination  Vaccine counseling  Anxiety  Elevated blood-pressure reading, without diagnosis of hypertension  Screen for STD (sexually transmitted disease) -     RPR+HIV+GC+CT Panel; Future -     Hepatitis C antibody;  Future -     Hepatitis B surface antigen; Future   Follow-up pending labs, yearly for physical

## 2021-07-16 NOTE — Addendum Note (Signed)
Addended by: Leonie Douglas on: 07/16/2021 04:23 PM   Modules accepted: Orders

## 2021-09-11 ENCOUNTER — Encounter: Payer: Self-pay | Admitting: Internal Medicine

## 2022-02-03 ENCOUNTER — Ambulatory Visit: Payer: Commercial Managed Care - PPO | Admitting: Medical

## 2022-02-10 ENCOUNTER — Telehealth (INDEPENDENT_AMBULATORY_CARE_PROVIDER_SITE_OTHER): Payer: Commercial Managed Care - PPO | Admitting: Medical

## 2022-02-10 ENCOUNTER — Other Ambulatory Visit (HOSPITAL_COMMUNITY): Payer: Self-pay

## 2022-02-10 ENCOUNTER — Telehealth: Payer: Self-pay | Admitting: Medical

## 2022-02-10 VITALS — Wt 180.0 lb

## 2022-02-10 DIAGNOSIS — F419 Anxiety disorder, unspecified: Secondary | ICD-10-CM

## 2022-02-10 DIAGNOSIS — F410 Panic disorder [episodic paroxysmal anxiety] without agoraphobia: Secondary | ICD-10-CM | POA: Diagnosis not present

## 2022-02-10 MED ORDER — FLUOXETINE HCL 20 MG PO TABS: 20.0000 mg | ORAL_TABLET | Freq: Every day | ORAL | 0 refills | Status: DC

## 2022-02-10 MED ORDER — LORAZEPAM 0.5 MG PO TABS
0.5000 mg | ORAL_TABLET | Freq: Every day | ORAL | 0 refills | Status: DC | PRN
  Filled 2022-02-10: qty 30, 15d supply, fill #0

## 2022-02-10 MED ORDER — LORAZEPAM 0.5 MG PO TABS: 0.5000 mg | ORAL_TABLET | Freq: Every day | ORAL | 0 refills | Status: DC | PRN

## 2022-02-10 NOTE — Telephone Encounter (Signed)
Rx Ativan printed, so I called into pharmacy

## 2022-02-10 NOTE — Progress Notes (Signed)
Subjective:     Patient ID: Damon Glenn, male   DOB: Dec 02, 2000, 22 y.o.   MRN: [id number]  This visit type was conducted due to national recommendations for restrictions regarding the COVID-19 Pandemic (e.g. social distancing) in an effort to limit this patient's exposure and mitigate transmission in our community.  Due to their co-morbid illnesses, this patient is at least at moderate risk for complications without adequate follow up.  This format is felt to be most appropriate for this patient at this time.    Documentation for virtual audio and video telecommunications through [place] encounter:  The patient was located at home. The provider was located in the office. The patient did consent to this visit and is aware of possible charges through their insurance for this visit.  The other persons participating in this telemedicine service were none. Time spent on call was 20 minutes and in review of previous records 20 minutes total.  This virtual service is not related to other E/M service within previous 7 days.   HPI Chief Complaint  Patient presents with   Anxiety    Anxiety x week   Virtual consult for bad anxiety for about a week.   About a week ago headed to dentist, felt anxious, had to pull over.  Had to have father come pick him up.  Since that day been having panic attacks several times daily.  Gets anxious outside the house.  Exercises regularly.  But mostly does his exercise and weight lifting at home.  Has been to gym in the past.  Has had panic attacks occasionally in the past but this last week it has been daily.  Hasn't taken Prozac in a few months.   Stopped it cold [place].  The Prozac wasn't causing side effects but he wasn't sure how much it was helping.  Lately hasn't wanted to leave house due to anxiety.  Does have some close friends that are understanding.  No hallucinations, no visual or auditory disturbances.  No other aggravating or relieving factors. No  other complaint.  No past medical history on file.  Current Outpatient Medications on File Prior to Visit  Medication Sig Dispense Refill   FLUoxetine (PROZAC) 20 MG tablet Take 1 tablet (20 mg total) by mouth daily. 90 tablet 3   No current facility-administered medications on file prior to visit.    Review of Systems As in subjective    Objective:   Physical Exam Due to coronavirus pandemic stay at home measures, patient visit was virtual and they were not examined in person.   Wt 180 lb (81.6 kg)   BMI 25.10 kg/m   Gen: wd, wn, nad Pleasant, answers questions appropriately      Assessment:     Encounter Diagnoses  Name Primary?   Panic attack Yes   Anxiety        Plan:     Discussed concerns, symptoms.  Discussed some coping skills.  Advised to try breathing skills every evening as discussed. Get back on/restart Prozac, 1/2 tablet daily for a week, then 1 tablet daily.   Can use medication below short term for panic.  Discussed risks/benefits and proper use of medication.  Referral to therapy.  Advised he touch base with me again in a few weeks.     Damon Glenn was seen today for anxiety.  Diagnoses and all orders for this visit:  Panic attack -     Ambulatory referral to Psychiatry  Anxiety -  Ambulatory referral to Psychiatry  Other orders -     LORazepam (ATIVAN) 0.5 MG tablet; Take 1 tablet (0.5 mg total) by mouth daily as needed for anxiety, may take up to twice daily as needed for anxiety attack.  F/u in a few weeks

## 2022-02-10 NOTE — Telephone Encounter (Signed)
Mom states not getting back in time to get meds from [facility], need switch pharmacy to [facility] in [place].  Escribed both & called to make sure they received.

## 2022-02-11 ENCOUNTER — Other Ambulatory Visit (HOSPITAL_COMMUNITY): Payer: Self-pay

## 2022-02-12 ENCOUNTER — Other Ambulatory Visit (HOSPITAL_COMMUNITY): Payer: Self-pay

## 2022-03-10 ENCOUNTER — Other Ambulatory Visit: Payer: Self-pay | Admitting: Medical

## 2022-03-10 ENCOUNTER — Other Ambulatory Visit (HOSPITAL_COMMUNITY): Payer: Self-pay

## 2022-03-10 ENCOUNTER — Telehealth: Payer: Self-pay | Admitting: Medical

## 2022-03-10 MED ORDER — LORAZEPAM 0.5 MG PO TABS
0.5000 mg | ORAL_TABLET | Freq: Every day | ORAL | 0 refills | Status: DC | PRN
  Filled 2022-03-10: qty 30, 30d supply, fill #0

## 2022-03-10 MED ORDER — LORAZEPAM 0.5 MG PO TABS: 0.5000 mg | ORAL_TABLET | Freq: Every day | ORAL | 0 refills | Status: DC | PRN

## 2022-03-10 NOTE — Telephone Encounter (Signed)
Refill on his ativan Please send to the  [place]

## 2022-03-11 ENCOUNTER — Other Ambulatory Visit (HOSPITAL_COMMUNITY): Payer: Self-pay

## 2022-04-11 ENCOUNTER — Telehealth: Payer: Self-pay | Admitting: Internal Medicine

## 2022-04-11 ENCOUNTER — Other Ambulatory Visit (HOSPITAL_COMMUNITY): Payer: Self-pay

## 2022-04-11 ENCOUNTER — Other Ambulatory Visit: Payer: Self-pay | Admitting: Medical

## 2022-04-11 MED ORDER — FLUOXETINE HCL 20 MG PO TABS
20.0000 mg | ORAL_TABLET | Freq: Every day | ORAL | 0 refills | Status: DC
  Filled 2022-04-11: qty 90, 90d supply, fill #0

## 2022-04-11 MED ORDER — FLUOXETINE HCL 20 MG PO CAPS
20.0000 mg | ORAL_CAPSULE | Freq: Every day | ORAL | 0 refills | Status: DC
  Filled 2022-04-11: qty 30, 30d supply, fill #0

## 2022-04-11 NOTE — Telephone Encounter (Signed)
Pt needs a refill  fluoxetine rx to [facility] pharmacy?   I have refilled this but wants to know when do you want to see him back

## 2022-04-14 ENCOUNTER — Other Ambulatory Visit (HOSPITAL_COMMUNITY): Payer: Self-pay

## 2022-04-14 NOTE — Telephone Encounter (Signed)
Spoke with Melissa, and he is doing counseling online. He is doing better but not quite there. Melissa will tell him to schedule

## 2022-04-17 ENCOUNTER — Other Ambulatory Visit (HOSPITAL_COMMUNITY): Payer: Self-pay

## 2022-07-22 ENCOUNTER — Other Ambulatory Visit (HOSPITAL_COMMUNITY): Payer: Self-pay

## 2022-07-22 ENCOUNTER — Telehealth: Payer: Commercial Managed Care - PPO | Admitting: Medical

## 2022-07-22 ENCOUNTER — Encounter: Payer: Self-pay | Admitting: Medical

## 2022-07-22 VITALS — Ht 72.0 in | Wt 180.0 lb

## 2022-07-22 DIAGNOSIS — F411 Generalized anxiety disorder: Secondary | ICD-10-CM | POA: Diagnosis not present

## 2022-07-22 DIAGNOSIS — F410 Panic disorder [episodic paroxysmal anxiety] without agoraphobia: Secondary | ICD-10-CM

## 2022-07-22 MED ORDER — FLUOXETINE HCL 20 MG PO CAPS
20.0000 mg | ORAL_CAPSULE | Freq: Every day | ORAL | 1 refills | Status: DC
  Filled 2022-07-22: qty 30, 30d supply, fill #0
  Filled 2022-08-23: qty 30, 30d supply, fill #1

## 2022-07-22 MED ORDER — FLUOXETINE HCL 10 MG PO CAPS
10.0000 mg | ORAL_CAPSULE | Freq: Every day | ORAL | 1 refills | Status: DC
  Filled 2022-07-22: qty 30, 30d supply, fill #0
  Filled 2022-08-23: qty 30, 30d supply, fill #1

## 2022-07-22 NOTE — Progress Notes (Signed)
Subjective:     Patient ID: Damon Glenn, male   DOB: 08/14/2000, 22 y.o.   MRN: [id number]  This visit type was conducted due to national recommendations for restrictions regarding the COVID-19 Pandemic (e.g. social distancing) in an effort to limit this patient's exposure and mitigate transmission in our community.  Due to their co-morbid illnesses, this patient is at least at moderate risk for complications without adequate follow up.  This format is felt to be most appropriate for this patient at this time.    Documentation for virtual audio and video telecommunications through [place] encounter:  The patient was located at home. The provider was located in the office. The patient did consent to this visit and is aware of possible charges through their insurance for this visit.  The other persons participating in this telemedicine service were none. Time spent on call was 15 minutes and in review of previous records 20 minutes total.  This virtual service is not related to other E/M service within previous 7 days.   HPI Chief Complaint  Patient presents with   Follow-up    VIRTUAL follow up on anxiety.   Virtual consult for anxiety  Since last visit he has seen some improvement.  Feels probably 60 to 70% better than last visit.  He did get set up with counseling through [facility] online.  He is doing counseling once a week with them.  He has had 3 sessions.  He feels like this been a positive thing.  They have encouraged him to focus away from being anxious about things he cannot control.  He has been doing more social things within a short drive in the house getting some exposure around people which is been helpful but was challenging prior.  He still does not feel comfortable getting too far away from the house given his current anxiety issues.  He does feel like the medication has been helpful but may be could be stronger.  He has not had to use an Ativan in several weeks.   Overall feels like he is making some progress dealing with his anxiety.  Sleeping okay.  Eating okay.  Work is stable no issues there.  No other aggravating or relieving factors. No other complaint.   No past medical history on file.  Current Outpatient Medications on File Prior to Visit  Medication Sig Dispense Refill   FLUoxetine (PROZAC) 20 MG tablet Take 1 tablet (20 mg total) by mouth daily. 90 tablet 0   LORazepam (ATIVAN) 0.5 MG tablet Take 1 tablet (0.5 mg total) by mouth daily as needed for anxiety, may take up to twice daily as needed for anxiety attack. (Patient not taking: Reported on 07/22/2022) 30 tablet 0   No current facility-administered medications on file prior to visit.    Review of Systems As in subjective    Objective:   Physical Exam Due to coronavirus pandemic stay at home measures, patient visit was virtual and they were not examined in person.   Ht 6' (1.829 m)   Wt 180 lb (81.6 kg)   BMI 24.41 kg/m   Gen: wd, wn, nad Pleasant, answers questions appropriately      Assessment:     Encounter Diagnoses  Name Primary?   Generalized anxiety disorder Yes   Panic attack         Plan:     Discussed concerns, symptoms.  Increase to 30 mg of Prozac.  Continue with counseling.  Glad to hear you  are seeing improvement.  Reinforced some of the coping skills that counseling has been helping him with.  Advised he touch base with me again in a few weeks.     Damon Glenn was seen today for follow-up.  Diagnoses and all orders for this visit:  Generalized anxiety disorder  Panic attack  Other orders -     FLUoxetine (PROZAC) 10 MG capsule; Take 1 capsule (10 mg total) by mouth daily. -     FLUoxetine (PROZAC) 20 MG capsule; Take 1 capsule (20 mg total) by mouth daily.   F/u with visit or call report in 46mo

## 2022-09-28 ENCOUNTER — Other Ambulatory Visit: Payer: Self-pay | Admitting: Medical

## 2022-09-29 ENCOUNTER — Other Ambulatory Visit (HOSPITAL_COMMUNITY): Payer: Self-pay

## 2022-09-29 MED ORDER — FLUOXETINE HCL 10 MG PO CAPS
10.0000 mg | ORAL_CAPSULE | Freq: Every day | ORAL | 1 refills | Status: DC
  Filled 2022-09-29: qty 90, 90d supply, fill #0
  Filled 2022-12-27: qty 90, 90d supply, fill #1

## 2022-09-29 MED ORDER — FLUOXETINE HCL 20 MG PO CAPS
20.0000 mg | ORAL_CAPSULE | Freq: Every day | ORAL | 1 refills | Status: DC
  Filled 2022-09-29: qty 90, 90d supply, fill #0
  Filled 2022-12-27: qty 90, 90d supply, fill #1

## 2022-10-06 ENCOUNTER — Ambulatory Visit: Payer: Commercial Managed Care - PPO | Admitting: Medical

## 2022-10-06 ENCOUNTER — Other Ambulatory Visit (HOSPITAL_COMMUNITY): Payer: Self-pay

## 2022-10-06 ENCOUNTER — Ambulatory Visit
Admission: RE | Admit: 2022-10-06 | Discharge: 2022-10-06 | Disposition: A | Discharge status: Home / Self Care | Payer: Commercial Managed Care - PPO | Source: Ambulatory Visit | Attending: Medical | Admitting: Medical

## 2022-10-06 VITALS — BP 120/72 | HR 85 | Temp 98.5°F | Wt 186.0 lb

## 2022-10-06 DIAGNOSIS — M542 Cervicalgia: Secondary | ICD-10-CM | POA: Diagnosis not present

## 2022-10-06 DIAGNOSIS — R6884 Jaw pain: Secondary | ICD-10-CM

## 2022-10-06 DIAGNOSIS — M62838 Other muscle spasm: Secondary | ICD-10-CM | POA: Diagnosis not present

## 2022-10-06 MED ORDER — TIZANIDINE HCL 4 MG PO TABS
4.0000 mg | ORAL_TABLET | Freq: Two times a day (BID) | ORAL | 0 refills | Status: DC | PRN
  Filled 2022-10-06: qty 12, 6d supply, fill #0

## 2022-10-06 MED ORDER — IBUPROFEN 600 MG PO TABS
600.0000 mg | ORAL_TABLET | Freq: Three times a day (TID) | ORAL | 0 refills | Status: DC | PRN
  Filled 2022-10-06: qty 30, 10d supply, fill #0
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
## 2022-10-06 NOTE — Patient Instructions (Signed)
Please go to [facility] for your neck and mandible xray.   Their hours are 8am - 4:30 pm Monday - Friday.  Take your insurance card with you.  [facility] [phone]   [address] [place], [place] [address]

## 2022-10-07 NOTE — Progress Notes (Signed)
Results sent through MyChart

## 2022-12-27 ENCOUNTER — Other Ambulatory Visit (HOSPITAL_COMMUNITY): Payer: Self-pay

## 2023-04-17 ENCOUNTER — Other Ambulatory Visit (HOSPITAL_COMMUNITY): Payer: Self-pay

## 2023-04-17 ENCOUNTER — Other Ambulatory Visit: Payer: Self-pay | Admitting: Medical

## 2023-04-17 MED ORDER — FLUOXETINE HCL 20 MG PO CAPS
20.0000 mg | ORAL_CAPSULE | Freq: Every day | ORAL | 0 refills | Status: DC
  Filled 2023-04-17: qty 90, 90d supply, fill #0

## 2023-04-17 MED ORDER — FLUOXETINE HCL 10 MG PO CAPS
10.0000 mg | ORAL_CAPSULE | Freq: Every day | ORAL | 0 refills | Status: DC
  Filled 2023-04-17: qty 90, 90d supply, fill #0

## 2023-04-17 NOTE — Telephone Encounter (Signed)
 Patient will schedule an appt soon

## 2023-08-11 ENCOUNTER — Telehealth: Payer: Self-pay | Admitting: Medical

## 2023-08-11 NOTE — Telephone Encounter (Signed)
 Pt needs a refill on lorazapam, he hasn't used in a year and it is expired and he would like to have some on hand. Uses  [place] [facility]

## 2023-08-12 ENCOUNTER — Other Ambulatory Visit (HOSPITAL_COMMUNITY): Payer: Self-pay

## 2023-08-12 ENCOUNTER — Other Ambulatory Visit: Payer: Self-pay | Admitting: Medical

## 2023-08-12 MED ORDER — LORAZEPAM 0.5 MG PO TABS
0.5000 mg | ORAL_TABLET | Freq: Every day | ORAL | 0 refills | Status: DC | PRN
  Filled 2023-08-12: qty 30, 30d supply, fill #0

## 2023-08-13 ENCOUNTER — Other Ambulatory Visit (HOSPITAL_COMMUNITY): Payer: Self-pay

## 2023-08-28 ENCOUNTER — Other Ambulatory Visit (HOSPITAL_BASED_OUTPATIENT_CLINIC_OR_DEPARTMENT_OTHER): Payer: Self-pay

## 2023-08-28 ENCOUNTER — Other Ambulatory Visit (HOSPITAL_COMMUNITY): Payer: Self-pay

## 2023-08-28 ENCOUNTER — Other Ambulatory Visit: Payer: Self-pay | Admitting: Medical

## 2023-08-28 ENCOUNTER — Telehealth: Payer: Self-pay | Admitting: Medical

## 2023-08-28 MED ORDER — FLUOXETINE HCL 10 MG PO CAPS
10.0000 mg | ORAL_CAPSULE | Freq: Every day | ORAL | 0 refills | Status: DC
  Filled 2023-08-28: qty 30, 30d supply, fill #0

## 2023-08-28 MED ORDER — FLUOXETINE HCL 20 MG PO CAPS
20.0000 mg | ORAL_CAPSULE | Freq: Every day | ORAL | 0 refills | Status: DC
  Filled 2023-08-28: qty 30, 30d supply, fill #0

## 2023-08-28 NOTE — Telephone Encounter (Signed)
 Pt is needing a refill on prozac  because he will run out over the weekend.  He knows he needs an appointment and will schedule a med check next week with you.   [place] - [facility]

## 2023-09-22 ENCOUNTER — Telehealth: Admitting: Medical

## 2023-09-22 ENCOUNTER — Other Ambulatory Visit (HOSPITAL_COMMUNITY): Payer: Self-pay

## 2023-09-22 VITALS — Wt 185.0 lb

## 2023-09-22 DIAGNOSIS — Z79899 Other long term (current) drug therapy: Secondary | ICD-10-CM

## 2023-09-22 DIAGNOSIS — F419 Anxiety disorder, unspecified: Secondary | ICD-10-CM | POA: Diagnosis not present

## 2023-09-22 MED ORDER — FLUOXETINE HCL 10 MG PO CAPS
10.0000 mg | ORAL_CAPSULE | Freq: Every day | ORAL | 1 refills | Status: AC
  Filled 2023-09-22 – 2024-02-03 (×4): qty 90, 90d supply, fill #0

## 2023-09-22 MED ORDER — FLUOXETINE HCL 20 MG PO CAPS
20.0000 mg | ORAL_CAPSULE | Freq: Every day | ORAL | 1 refills | Status: AC
  Filled 2023-09-22 – 2024-02-03 (×3): qty 90, 90d supply, fill #0

## 2023-09-22 NOTE — Progress Notes (Signed)
  Subjective:     Patient ID: Damon Glenn, male   DOB: 09/28/2000, 23 y.o.   MRN: [id number]  This visit type was conducted due to national recommendations for restrictions regarding the COVID-19 Pandemic (e.g. social distancing) in an effort to limit this patient's exposure and mitigate transmission in our community.  Due to their co-morbid illnesses, this patient is at least at moderate risk for complications without adequate follow up.  This format is felt to be most appropriate for this patient at this time.    Documentation for virtual audio and video telecommunications through [facility] encounter:  The patient was located at home. The provider was located in the office. The patient did consent to this visit and is aware of possible charges through their insurance for this visit.  The other persons participating in this telemedicine service were none. Time spent on call was 20 minutes and in review of previous records 20 minutes total.  This virtual service is not related to other E/M service within previous 7 days.   HPI Chief Complaint  Patient presents with   Medical Management of Chronic Issues    Med check-     Here today for virtual med check on medication management  He continues on Prozac  30mg  daily.  Doing well on this.  Rarely has used Ativan .  He reports no particular problems.  No issues with social activities.  He is exercising 5 days/week at the gym.  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  He recently bought a house.  This is his first house on his own and has moved out from his parents  No current intermittent relationship  He just recently got a dog.  No particular issues   Review of Systems As in subjective    Objective:   Physical Exam Due to coronavirus pandemic stay at home measures, patient visit was virtual and they were not examined in person.   Wt 185 lb (83.9 kg)   BMI 25.09 kg/m   Gen: wd, wn, nad Psych: Pleasant, answers  questions appropriately     Assessment:     Encounter Diagnoses  Name Primary?   Anxiety Yes   Medication management        Plan:     Doing fine on current therapy. Congratulated him on his home purchase. Continue with healthy diet and exercise  Damon Glenn was seen today for medical management of chronic issues.  Diagnoses and all orders for this visit:  Anxiety  Medication management  Other orders -     FLUoxetine  (PROZAC ) 10 MG capsule; Take 1 capsule (10 mg total) by mouth daily. -     FLUoxetine  (PROZAC ) 20 MG capsule; Take 1 capsule (20 mg total) by mouth daily.  Follow-up in 4 to 6 months for physical

## 2023-10-22 ENCOUNTER — Telehealth: Payer: Self-pay | Admitting: Medical

## 2023-10-22 NOTE — Telephone Encounter (Signed)
 Pt decided he wants a refill on his lorazepam  sent to  [place] [facility]

## 2023-10-23 ENCOUNTER — Other Ambulatory Visit: Payer: Self-pay | Admitting: Medical

## 2023-10-23 ENCOUNTER — Other Ambulatory Visit (HOSPITAL_COMMUNITY): Payer: Self-pay

## 2023-10-23 MED ORDER — LORAZEPAM 0.5 MG PO TABS
0.5000 mg | ORAL_TABLET | Freq: Two times a day (BID) | ORAL | 0 refills | Status: DC | PRN
  Filled 2023-10-23: qty 30, 15d supply, fill #0

## 2023-12-09 ENCOUNTER — Other Ambulatory Visit: Payer: Self-pay | Admitting: Medical

## 2023-12-09 ENCOUNTER — Other Ambulatory Visit (HOSPITAL_COMMUNITY): Payer: Self-pay

## 2023-12-09 ENCOUNTER — Telehealth: Payer: Self-pay | Admitting: Medical

## 2023-12-09 MED ORDER — LORAZEPAM 0.5 MG PO TABS
0.5000 mg | ORAL_TABLET | Freq: Two times a day (BID) | ORAL | 2 refills | Status: AC | PRN
  Filled 2023-12-09: qty 30, 15d supply, fill #0
  Filled 2024-01-07: qty 30, 15d supply, fill #1
  Filled 2024-02-10: qty 30, 15d supply, fill #2

## 2023-12-09 NOTE — Telephone Encounter (Signed)
 Pt needs a refill on lorazepam  to [place] [facility]

## 2024-01-08 ENCOUNTER — Other Ambulatory Visit (HOSPITAL_COMMUNITY): Payer: Self-pay

## 2024-01-08 ENCOUNTER — Other Ambulatory Visit: Payer: Self-pay

## 2024-02-03 ENCOUNTER — Other Ambulatory Visit (HOSPITAL_COMMUNITY): Payer: Self-pay

## 2024-02-03 ENCOUNTER — Other Ambulatory Visit: Payer: Self-pay

## 2024-02-04 ENCOUNTER — Other Ambulatory Visit (HOSPITAL_COMMUNITY): Payer: Self-pay

## 2024-02-10 ENCOUNTER — Other Ambulatory Visit: Payer: Self-pay

## 2024-02-10 ENCOUNTER — Other Ambulatory Visit (HOSPITAL_COMMUNITY): Payer: Self-pay
